# Patient Record
Sex: Male | Born: 1950 | Race: White | Hispanic: No | Marital: Single | Smoking: Never smoker
Health system: Southern US, Community
[De-identification: ages and names within clinical notes are randomized; demographics above are authoritative.]

## PROBLEM LIST (undated history)

## (undated) DIAGNOSIS — L0291 Cutaneous abscess, unspecified: Secondary | ICD-10-CM

## (undated) DIAGNOSIS — E7800 Pure hypercholesterolemia, unspecified: Secondary | ICD-10-CM

## (undated) DIAGNOSIS — M869 Osteomyelitis, unspecified: Secondary | ICD-10-CM

## (undated) DIAGNOSIS — E119 Type 2 diabetes mellitus without complications: Secondary | ICD-10-CM

## (undated) DIAGNOSIS — N183 Chronic kidney disease, stage 3 unspecified: Secondary | ICD-10-CM

## (undated) DIAGNOSIS — I5022 Chronic systolic (congestive) heart failure: Secondary | ICD-10-CM

## (undated) DIAGNOSIS — B20 Human immunodeficiency virus [HIV] disease: Secondary | ICD-10-CM

## (undated) DIAGNOSIS — I10 Essential (primary) hypertension: Secondary | ICD-10-CM

## (undated) HISTORY — PX: CARDIAC VALVE REPLACEMENT: SHX585

---

## 2019-05-15 ENCOUNTER — Ambulatory Visit (HOSPITAL_COMMUNITY)
Admission: EM | Admit: 2019-05-15 | Discharge: 2019-05-15 | Disposition: A | Discharge status: Home / Self Care | Payer: Medicare Other | Source: Home / Self Care

## 2019-05-15 ENCOUNTER — Inpatient Hospital Stay (HOSPITAL_COMMUNITY)
Admission: EM | Admit: 2019-05-15 | Discharge: 2019-05-25 | DRG: 638 | Disposition: E | Payer: Medicare Other | Source: Other Acute Inpatient Hospital | Attending: Internal Medicine | Admitting: Internal Medicine

## 2019-05-15 ENCOUNTER — Emergency Department (HOSPITAL_COMMUNITY): Payer: Medicare Other

## 2019-05-15 ENCOUNTER — Other Ambulatory Visit: Payer: Self-pay

## 2019-05-15 ENCOUNTER — Encounter (HOSPITAL_COMMUNITY): Payer: Self-pay | Admitting: Emergency Medicine

## 2019-05-15 DIAGNOSIS — E1169 Type 2 diabetes mellitus with other specified complication: Secondary | ICD-10-CM | POA: Diagnosis not present

## 2019-05-15 DIAGNOSIS — Z7982 Long term (current) use of aspirin: Secondary | ICD-10-CM

## 2019-05-15 DIAGNOSIS — E11628 Type 2 diabetes mellitus with other skin complications: Secondary | ICD-10-CM | POA: Diagnosis present

## 2019-05-15 DIAGNOSIS — L97309 Non-pressure chronic ulcer of unspecified ankle with unspecified severity: Secondary | ICD-10-CM

## 2019-05-15 DIAGNOSIS — E1151 Type 2 diabetes mellitus with diabetic peripheral angiopathy without gangrene: Secondary | ICD-10-CM | POA: Diagnosis present

## 2019-05-15 DIAGNOSIS — I872 Venous insufficiency (chronic) (peripheral): Secondary | ICD-10-CM | POA: Diagnosis present

## 2019-05-15 DIAGNOSIS — E11622 Type 2 diabetes mellitus with other skin ulcer: Secondary | ICD-10-CM | POA: Diagnosis not present

## 2019-05-15 DIAGNOSIS — N179 Acute kidney failure, unspecified: Secondary | ICD-10-CM | POA: Diagnosis present

## 2019-05-15 DIAGNOSIS — R000 Tachycardia, unspecified: Secondary | ICD-10-CM | POA: Diagnosis present

## 2019-05-15 DIAGNOSIS — I5042 Chronic combined systolic (congestive) and diastolic (congestive) heart failure: Secondary | ICD-10-CM | POA: Diagnosis present

## 2019-05-15 DIAGNOSIS — M868X7 Other osteomyelitis, ankle and foot: Secondary | ICD-10-CM | POA: Diagnosis present

## 2019-05-15 DIAGNOSIS — E669 Obesity, unspecified: Secondary | ICD-10-CM | POA: Diagnosis present

## 2019-05-15 DIAGNOSIS — B20 Human immunodeficiency virus [HIV] disease: Secondary | ICD-10-CM | POA: Diagnosis present

## 2019-05-15 DIAGNOSIS — R238 Other skin changes: Secondary | ICD-10-CM | POA: Diagnosis present

## 2019-05-15 DIAGNOSIS — Z952 Presence of prosthetic heart valve: Secondary | ICD-10-CM

## 2019-05-15 DIAGNOSIS — I10 Essential (primary) hypertension: Secondary | ICD-10-CM | POA: Diagnosis present

## 2019-05-15 DIAGNOSIS — I4901 Ventricular fibrillation: Secondary | ICD-10-CM | POA: Diagnosis not present

## 2019-05-15 DIAGNOSIS — S91002A Unspecified open wound, left ankle, initial encounter: Secondary | ICD-10-CM

## 2019-05-15 DIAGNOSIS — R54 Age-related physical debility: Secondary | ICD-10-CM | POA: Diagnosis present

## 2019-05-15 DIAGNOSIS — I130 Hypertensive heart and chronic kidney disease with heart failure and stage 1 through stage 4 chronic kidney disease, or unspecified chronic kidney disease: Secondary | ICD-10-CM | POA: Diagnosis present

## 2019-05-15 DIAGNOSIS — M65862 Other synovitis and tenosynovitis, left lower leg: Secondary | ICD-10-CM | POA: Diagnosis present

## 2019-05-15 DIAGNOSIS — N1832 Chronic kidney disease, stage 3b: Secondary | ICD-10-CM | POA: Diagnosis present

## 2019-05-15 DIAGNOSIS — I5022 Chronic systolic (congestive) heart failure: Secondary | ICD-10-CM | POA: Diagnosis present

## 2019-05-15 DIAGNOSIS — Z7984 Long term (current) use of oral hypoglycemic drugs: Secondary | ICD-10-CM

## 2019-05-15 DIAGNOSIS — Z20822 Contact with and (suspected) exposure to covid-19: Secondary | ICD-10-CM | POA: Diagnosis present

## 2019-05-15 DIAGNOSIS — D72829 Elevated white blood cell count, unspecified: Secondary | ICD-10-CM | POA: Diagnosis present

## 2019-05-15 DIAGNOSIS — E119 Type 2 diabetes mellitus without complications: Secondary | ICD-10-CM

## 2019-05-15 DIAGNOSIS — R0902 Hypoxemia: Secondary | ICD-10-CM

## 2019-05-15 DIAGNOSIS — I472 Ventricular tachycardia: Secondary | ICD-10-CM | POA: Diagnosis not present

## 2019-05-15 DIAGNOSIS — E1152 Type 2 diabetes mellitus with diabetic peripheral angiopathy with gangrene: Secondary | ICD-10-CM | POA: Diagnosis present

## 2019-05-15 DIAGNOSIS — E1122 Type 2 diabetes mellitus with diabetic chronic kidney disease: Secondary | ICD-10-CM | POA: Diagnosis present

## 2019-05-15 DIAGNOSIS — N183 Chronic kidney disease, stage 3 unspecified: Secondary | ICD-10-CM | POA: Diagnosis present

## 2019-05-15 DIAGNOSIS — Z6822 Body mass index (BMI) 22.0-22.9, adult: Secondary | ICD-10-CM

## 2019-05-15 DIAGNOSIS — E7800 Pure hypercholesterolemia, unspecified: Secondary | ICD-10-CM | POA: Diagnosis present

## 2019-05-15 DIAGNOSIS — Z79899 Other long term (current) drug therapy: Secondary | ICD-10-CM

## 2019-05-15 DIAGNOSIS — Z21 Asymptomatic human immunodeficiency virus [HIV] infection status: Secondary | ICD-10-CM | POA: Diagnosis present

## 2019-05-15 DIAGNOSIS — N189 Chronic kidney disease, unspecified: Secondary | ICD-10-CM | POA: Diagnosis present

## 2019-05-15 DIAGNOSIS — E785 Hyperlipidemia, unspecified: Secondary | ICD-10-CM | POA: Diagnosis present

## 2019-05-15 DIAGNOSIS — L97329 Non-pressure chronic ulcer of left ankle with unspecified severity: Secondary | ICD-10-CM | POA: Diagnosis present

## 2019-05-15 DIAGNOSIS — Z5329 Procedure and treatment not carried out because of patient's decision for other reasons: Secondary | ICD-10-CM | POA: Diagnosis not present

## 2019-05-15 DIAGNOSIS — M869 Osteomyelitis, unspecified: Secondary | ICD-10-CM | POA: Diagnosis present

## 2019-05-15 DIAGNOSIS — L089 Local infection of the skin and subcutaneous tissue, unspecified: Secondary | ICD-10-CM | POA: Diagnosis present

## 2019-05-15 DIAGNOSIS — L02612 Cutaneous abscess of left foot: Secondary | ICD-10-CM | POA: Diagnosis present

## 2019-05-15 HISTORY — DX: Chronic kidney disease, stage 3 unspecified: N18.30

## 2019-05-15 HISTORY — DX: Essential (primary) hypertension: I10

## 2019-05-15 HISTORY — DX: Pure hypercholesterolemia, unspecified: E78.00

## 2019-05-15 HISTORY — DX: Cutaneous abscess, unspecified: L02.91

## 2019-05-15 HISTORY — DX: Type 2 diabetes mellitus without complications: E11.9

## 2019-05-15 HISTORY — DX: Osteomyelitis, unspecified: M86.9

## 2019-05-15 HISTORY — DX: Chronic systolic (congestive) heart failure: I50.22

## 2019-05-15 HISTORY — DX: Human immunodeficiency virus (HIV) disease: B20

## 2019-05-15 LAB — LACTIC ACID, PLASMA
Lactic Acid, Venous: 2.2 mmol/L (ref 0.5–1.9)
Lactic Acid, Venous: 1.8 mmol/L (ref 0.5–1.9)

## 2019-05-15 LAB — COMPREHENSIVE METABOLIC PANEL
ALT: 23 U/L (ref 0–44)
AST: 27 U/L (ref 15–41)
Albumin: 2.7 g/dL — ABNORMAL LOW (ref 3.5–5.0)
Alkaline Phosphatase: 61 U/L (ref 38–126)
Anion gap: 16 — ABNORMAL HIGH (ref 5–15)
BUN: 142 mg/dL — ABNORMAL HIGH (ref 8–23)
CO2: 22 mmol/L (ref 22–32)
Calcium: 8.8 mg/dL — ABNORMAL LOW (ref 8.9–10.3)
Chloride: 100 mmol/L (ref 98–111)
Creatinine, Ser: 4.68 mg/dL — ABNORMAL HIGH (ref 0.61–1.24)
GFR calc Af Amer: 14 mL/min — ABNORMAL LOW (ref >60)
GFR calc non Af Amer: 12 mL/min — ABNORMAL LOW (ref >60)
Glucose, Bld: 190 mg/dL — ABNORMAL HIGH (ref 70–99)
Potassium: 3.7 mmol/L (ref 3.5–5.1)
Sodium: 138 mmol/L (ref 135–145)
Total Bilirubin: 0.9 mg/dL (ref 0.3–1.2)
Total Protein: 8.1 g/dL (ref 6.5–8.1)

## 2019-05-15 LAB — CBC WITH DIFFERENTIAL/PLATELET
Abs Immature Granulocytes: 0.14 10*3/uL — ABNORMAL HIGH (ref 0.00–0.07)
Basophils Absolute: 0 10*3/uL (ref 0.0–0.1)
Basophils Relative: 0 %
Eosinophils Absolute: 0 10*3/uL (ref 0.0–0.5)
Eosinophils Relative: 0 %
HCT: 38.3 % — ABNORMAL LOW (ref 39.0–52.0)
Hemoglobin: 12.4 g/dL — ABNORMAL LOW (ref 13.0–17.0)
Immature Granulocytes: 1 %
Lymphocytes Relative: 4 %
Lymphs Abs: 0.7 10*3/uL (ref 0.7–4.0)
MCH: 31.3 pg (ref 26.0–34.0)
MCHC: 32.4 g/dL (ref 30.0–36.0)
MCV: 96.7 fL (ref 80.0–100.0)
Monocytes Absolute: 0.7 10*3/uL (ref 0.1–1.0)
Monocytes Relative: 4 %
Neutro Abs: 14.5 10*3/uL — ABNORMAL HIGH (ref 1.7–7.7)
Neutrophils Relative %: 91 %
Platelets: 144 10*3/uL — ABNORMAL LOW (ref 150–400)
RBC: 3.96 MIL/uL — ABNORMAL LOW (ref 4.22–5.81)
RDW: 15.9 % — ABNORMAL HIGH (ref 11.5–15.5)
WBC: 16.1 10*3/uL — ABNORMAL HIGH (ref 4.0–10.5)
nRBC: 0 % (ref 0.0–0.2)

## 2019-05-15 LAB — PROTIME-INR
INR: 1.6 — ABNORMAL HIGH (ref 0.8–1.2)
Prothrombin Time: 19.2 seconds — ABNORMAL HIGH (ref 11.4–15.2)

## 2019-05-15 MED ORDER — PIPERACILLIN-TAZOBACTAM 3.375 G IVPB 30 MIN
3.3750 g | Freq: Once | INTRAVENOUS | Status: AC
  Administered 2019-05-15: 3.375 g via INTRAVENOUS
  Filled 2019-05-15: qty 50

## 2019-05-15 MED ORDER — VANCOMYCIN HCL IN DEXTROSE 1-5 GM/200ML-% IV SOLN
1000.0000 mg | Freq: Once | INTRAVENOUS | Status: AC
  Administered 2019-05-15: 1000 mg via INTRAVENOUS
  Filled 2019-05-15: qty 200

## 2019-05-15 MED ORDER — SODIUM CHLORIDE 0.9 % IV BOLUS
500.0000 mL | Freq: Once | INTRAVENOUS | Status: AC
  Administered 2019-05-16: 500 mL via INTRAVENOUS

## 2019-05-15 NOTE — ED Triage Notes (Signed)
Patient is being discharged from the [facility]gent [facility] and sent to the Emergency Department by private vehicle.  Per Patterson Hammersmith, PA, and Dr. Leonides Grills, patient is stable but in need of higher level of care due to diabetic wound in joint. Patient is aware and verbalizes understanding of plan of care.

## 2019-05-15 NOTE — ED Provider Notes (Signed)
[facility] Provider Note   CSN: [id number] Arrival date & time: 05-26-19  1607     History Chief Complaint  Patient presents with  . diabetic wound    [facility] is a 69 y.o. male.  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  Patient reports over the last 2 to 3 days its become larger and started draining and becoming foul swelling as well as surrounding erythema.  Patient reports that he is visiting from [place] and it appears things have gotten worse since driving down in the car.  He denies systemic symptoms and states he otherwise feels fine however was noted to have a temperature of 100.2 upon arrival here.  He denies any chest pain, shortness of breath, nausea, vomiting or abdominal pain.  The history is provided by the patient.       Past Medical History:  Diagnosis Date  . Diabetes mellitus without complication ([place])   . High cholesterol   . HIV (human immunodeficiency virus infection) ([place])   . Hypertension     There are no problems to display for this patient.   Past Surgical History:  Procedure Laterality Date  . CARDIAC VALVE REPLACEMENT         No family history on file.  Social History   Tobacco Use  . Smoking status: Not on file  Substance Use Topics  . Alcohol use: Not on file  . Drug use: Not on file    Home Medications Prior to Admission medications   Medication Sig Start Date End Date Taking? Authorizing Provider  aspirin EC 81 MG tablet Take 81 mg by mouth in the morning.   Yes [provider]  furosemide (LASIX) 40 MG tablet Take 40 mg by mouth in the morning.  03/11/19  Yes [provider]  gabapentin (NEURONTIN) 300 MG capsule Take 300 mg by mouth in the morning and at bedtime.  04/19/19  Yes  [provider]  glipiZIDE (GLUCOTROL XL) 5 MG 24 hr tablet Take 5 mg by mouth daily after breakfast.  04/10/19  Yes [provider]  metoprolol tartrate (LOPRESSOR) 100 MG tablet Take 50 mg by mouth 2 (two) times daily.  02/08/18  Yes [provider]  simvastatin (ZOCOR) 40 MG tablet Take 40 mg by mouth at bedtime. 04/25/19  Yes [provider]  TRIUMEQ 600-50-300 MG tablet Take 1 tablet by mouth daily.  05/09/19  Yes [provider]  metolazone (ZAROXOLYN) 2.5 MG tablet Take 2.5 mg by mouth 2 (two) times a week. ON HOLD 04/10/19   [provider]    Allergies    Patient has no known allergies.  Review of Systems   Review of Systems  All other systems reviewed and are negative.   Physical Exam Updated Vital Signs BP 107/74 (BP Location: Left Arm)   Pulse 78   Temp 98.1 F (36.7 C) (Oral)   Resp 16   Ht 5\' 9"  (1.753 m)   Wt 70.3 kg   SpO2 100%   BMI 22.89 kg/m   Physical Exam Vitals and nursing note reviewed.  Constitutional:      General: He is not in acute distress.    Appearance: He is well-developed. He is obese. He is ill-appearing.     Comments: Chronically ill-appearing  HENT:     Head: Normocephalic and atraumatic.  Eyes:     Conjunctiva/sclera: Conjunctivae normal.     Pupils: Pupils are equal, round, and reactive to light.  Cardiovascular:     Rate and Rhythm: Normal rate and regular rhythm.     Heart sounds: No murmur.  Pulmonary:     Effort: Pulmonary effort is normal. No respiratory distress.     Breath sounds: Normal breath sounds. No wheezing or rales.  Abdominal:     General: There is no distension.     Palpations: Abdomen is soft.     Tenderness: There is no abdominal tenderness. There is no guarding or rebound.  Musculoskeletal:        General: Tenderness present. Normal range of motion.     Cervical back: Normal range of motion and neck supple.     Left lower leg: No edema.       Feet:      Comments: Skin findings consistent with chronic venous insufficiency  Skin:    General: Skin is warm and dry.     Findings: No erythema or rash.  Neurological:     General: No focal deficit present.     Mental Status: He is alert and oriented to person, place, and time. Mental status is at baseline.  Psychiatric:        Mood and Affect: Mood normal.        Behavior: Behavior normal.        Thought Content: Thought content normal.     ED Results / Procedures / Treatments   Labs (all labs ordered are listed, but only abnormal results are displayed) Labs Reviewed  COMPREHENSIVE METABOLIC PANEL - Abnormal; Notable for the following components:      Result Value   Glucose, Bld 190 (*)    BUN 142 (*)    Creatinine, Ser 4.68 (*)    Calcium 8.8 (*)    Albumin 2.7 (*)    GFR calc non Af Amer 12 (*)    GFR calc Af Amer 14 (*)    Anion gap 16 (*)    All other components within normal limits  LACTIC ACID, PLASMA - Abnormal; Notable for the following components:   Lactic Acid, Venous 2.2 (*)    All other components within normal limits  CBC WITH DIFFERENTIAL/PLATELET - Abnormal; Notable for the following components:   WBC 16.1 (*)    RBC 3.96 (*)    Hemoglobin 12.4 (*)    HCT 38.3 (*)    RDW 15.9 (*)    Platelets 144 (*)    Neutro Abs 14.5 (*)    Abs Immature Granulocytes 0.14 (*)    All other components within normal limits  PROTIME-INR - Abnormal; Notable for the following components:   Prothrombin Time 19.2 (*)    INR 1.6 (*)    All other components within normal limits  CULTURE, BLOOD (ROUTINE X 2)  CULTURE, BLOOD (ROUTINE X 2)  LACTIC ACID, PLASMA  URINALYSIS, ROUTINE W REFLEX MICROSCOPIC    EKG None  Radiology DG Ankle Complete Left  Result Date: 2019/05/31 CLINICAL DATA:  69 year old male with diabetic wound. EXAM: LEFT ANKLE COMPLETE - 3+ VIEW COMPARISON:  None. FINDINGS: There is no acute fracture or dislocation. No bone erosion or periosteal elevation. The ankle  mortise is intact. The bones are mildly osteopenic. There is soft tissue swelling and ulceration with small pockets of soft tissue air over the lateral malleolus in keeping with diabetic  wound. Correlation with clinical exam recommended. IMPRESSION: 1. No acute fracture or dislocation. No radiographic evidence of acute osteomyelitis. MRI or a WBC nuclear scan may provide better evaluation if there is high clinical concern for acute osteomyelitis. 2. Soft tissue swelling and ulceration over the lateral malleolus in keeping with diabetic wound. Electronically Signed   By: Elgie Collard M.D.   On: 05/23/2019 17:17    Procedures Procedures (including critical care time)  Medications Ordered in ED Medications - No data to display  ED Course  I have reviewed the triage vital signs and the nursing notes.  Pertinent labs & imaging results that were available during my care of the patient were reviewed by me and considered in my medical decision making (see chart for details).    MDM Rules/Calculators/A&P                     Patient is an elderly gentleman with multiple medical problems including HIV, diabetes and chronic kidney disease presenting today with necrotic foul-smelling wound to the left lateral malleolus.  He does have surrounding erythema and based on vital signs he was febrile to 100.2 earlier today.  Patient is visiting from out of town and has no close follow-up.  He verges on sepsis at this time with a lactate of 2.2, leukocytosis of 16,000 and it seems that his creatinine is at baseline at 4.68.  Feel that patient definitely needs antibiotics and warrants admission to ensure wound is improving and wound care.  MDM Number of Diagnoses or Management Options   Amount and/or Complexity of Data Reviewed Clinical lab tests: ordered and reviewed Tests in the radiology section of CPT: ordered and reviewed Discussion of test results with the performing providers: yes Decide to obtain  previous medical records or to obtain history from someone other than the patient: yes Obtain history from someone other than the patient: no Review and summarize past medical records: no Discuss the patient with other providers: yes Independent visualization of images, tracings, or specimens: yes  Risk of Complications, Morbidity, and/or Mortality Presenting problems: moderate Diagnostic procedures: minimal Management options: low  Patient Progress Patient progress: stable    Final Clinical Impression(s) / ED Diagnoses Final diagnoses:  Wound of ankle, left, initial encounter  Diabetic ankle ulcer (HCC)    Rx / DC Orders ED Discharge Orders    None       Gwyneth Sprout, MD 05/19/2019 2224

## 2019-05-15 NOTE — ED Triage Notes (Addendum)
Sent here from [facility] for further follow up of diabetic wound on left foot. Traveling from [place] - staying at a hotel-- wound is bandaged from [facility]

## 2019-05-16 ENCOUNTER — Encounter (HOSPITAL_COMMUNITY): Payer: Medicare Other

## 2019-05-16 ENCOUNTER — Inpatient Hospital Stay (HOSPITAL_COMMUNITY): Payer: Medicare Other

## 2019-05-16 ENCOUNTER — Encounter (HOSPITAL_COMMUNITY): Payer: Self-pay | Admitting: Internal Medicine

## 2019-05-16 DIAGNOSIS — N189 Chronic kidney disease, unspecified: Secondary | ICD-10-CM | POA: Diagnosis not present

## 2019-05-16 DIAGNOSIS — E1152 Type 2 diabetes mellitus with diabetic peripheral angiopathy with gangrene: Secondary | ICD-10-CM | POA: Diagnosis present

## 2019-05-16 DIAGNOSIS — E11622 Type 2 diabetes mellitus with other skin ulcer: Secondary | ICD-10-CM | POA: Diagnosis present

## 2019-05-16 DIAGNOSIS — L97329 Non-pressure chronic ulcer of left ankle with unspecified severity: Secondary | ICD-10-CM | POA: Diagnosis present

## 2019-05-16 DIAGNOSIS — E1122 Type 2 diabetes mellitus with diabetic chronic kidney disease: Secondary | ICD-10-CM | POA: Diagnosis present

## 2019-05-16 DIAGNOSIS — M65862 Other synovitis and tenosynovitis, left lower leg: Secondary | ICD-10-CM | POA: Diagnosis present

## 2019-05-16 DIAGNOSIS — Z5329 Procedure and treatment not carried out because of patient's decision for other reasons: Secondary | ICD-10-CM | POA: Diagnosis not present

## 2019-05-16 DIAGNOSIS — N1832 Chronic kidney disease, stage 3b: Secondary | ICD-10-CM | POA: Diagnosis present

## 2019-05-16 DIAGNOSIS — E1169 Type 2 diabetes mellitus with other specified complication: Secondary | ICD-10-CM | POA: Diagnosis present

## 2019-05-16 DIAGNOSIS — Z7982 Long term (current) use of aspirin: Secondary | ICD-10-CM | POA: Diagnosis not present

## 2019-05-16 DIAGNOSIS — I130 Hypertensive heart and chronic kidney disease with heart failure and stage 1 through stage 4 chronic kidney disease, or unspecified chronic kidney disease: Secondary | ICD-10-CM | POA: Diagnosis present

## 2019-05-16 DIAGNOSIS — I96 Gangrene, not elsewhere classified: Secondary | ICD-10-CM | POA: Diagnosis not present

## 2019-05-16 DIAGNOSIS — L089 Local infection of the skin and subcutaneous tissue, unspecified: Secondary | ICD-10-CM | POA: Diagnosis present

## 2019-05-16 DIAGNOSIS — N179 Acute kidney failure, unspecified: Secondary | ICD-10-CM | POA: Diagnosis present

## 2019-05-16 DIAGNOSIS — Z952 Presence of prosthetic heart valve: Secondary | ICD-10-CM | POA: Diagnosis not present

## 2019-05-16 DIAGNOSIS — E11628 Type 2 diabetes mellitus with other skin complications: Secondary | ICD-10-CM | POA: Diagnosis present

## 2019-05-16 DIAGNOSIS — I4901 Ventricular fibrillation: Secondary | ICD-10-CM | POA: Diagnosis not present

## 2019-05-16 DIAGNOSIS — E7800 Pure hypercholesterolemia, unspecified: Secondary | ICD-10-CM | POA: Diagnosis present

## 2019-05-16 DIAGNOSIS — L02612 Cutaneous abscess of left foot: Secondary | ICD-10-CM | POA: Diagnosis present

## 2019-05-16 DIAGNOSIS — I5042 Chronic combined systolic (congestive) and diastolic (congestive) heart failure: Secondary | ICD-10-CM | POA: Diagnosis present

## 2019-05-16 DIAGNOSIS — Z21 Asymptomatic human immunodeficiency virus [HIV] infection status: Secondary | ICD-10-CM | POA: Diagnosis present

## 2019-05-16 DIAGNOSIS — Z20822 Contact with and (suspected) exposure to covid-19: Secondary | ICD-10-CM | POA: Diagnosis present

## 2019-05-16 DIAGNOSIS — M86672 Other chronic osteomyelitis, left ankle and foot: Secondary | ICD-10-CM | POA: Diagnosis not present

## 2019-05-16 DIAGNOSIS — Z79899 Other long term (current) drug therapy: Secondary | ICD-10-CM | POA: Diagnosis not present

## 2019-05-16 DIAGNOSIS — S91002A Unspecified open wound, left ankle, initial encounter: Secondary | ICD-10-CM

## 2019-05-16 DIAGNOSIS — M868X7 Other osteomyelitis, ankle and foot: Secondary | ICD-10-CM | POA: Diagnosis present

## 2019-05-16 DIAGNOSIS — Z7984 Long term (current) use of oral hypoglycemic drugs: Secondary | ICD-10-CM | POA: Diagnosis not present

## 2019-05-16 DIAGNOSIS — I472 Ventricular tachycardia: Secondary | ICD-10-CM | POA: Diagnosis not present

## 2019-05-16 DIAGNOSIS — E785 Hyperlipidemia, unspecified: Secondary | ICD-10-CM | POA: Diagnosis present

## 2019-05-16 DIAGNOSIS — M869 Osteomyelitis, unspecified: Secondary | ICD-10-CM | POA: Diagnosis not present

## 2019-05-16 LAB — CBC WITH DIFFERENTIAL/PLATELET
Abs Immature Granulocytes: 0 10*3/uL (ref 0.00–0.07)
Basophils Absolute: 0 10*3/uL (ref 0.0–0.1)
Basophils Relative: 0 %
Eosinophils Absolute: 0 10*3/uL (ref 0.0–0.5)
Eosinophils Relative: 0 %
HCT: 36.2 % — ABNORMAL LOW (ref 39.0–52.0)
Hemoglobin: 11.6 g/dL — ABNORMAL LOW (ref 13.0–17.0)
Lymphocytes Relative: 1 %
Lymphs Abs: 0.2 10*3/uL — ABNORMAL LOW (ref 0.7–4.0)
MCH: 30.6 pg (ref 26.0–34.0)
MCHC: 32 g/dL (ref 30.0–36.0)
MCV: 95.5 fL (ref 80.0–100.0)
Monocytes Absolute: 0.5 10*3/uL (ref 0.1–1.0)
Monocytes Relative: 3 %
Neutro Abs: 15.6 10*3/uL — ABNORMAL HIGH (ref 1.7–7.7)
Neutrophils Relative %: 96 %
Platelets: DECREASED 10*3/uL (ref 150–400)
RBC: 3.79 MIL/uL — ABNORMAL LOW (ref 4.22–5.81)
RDW: 15.9 % — ABNORMAL HIGH (ref 11.5–15.5)
WBC: 16.2 10*3/uL — ABNORMAL HIGH (ref 4.0–10.5)
nRBC: 0 % (ref 0.0–0.2)

## 2019-05-16 LAB — SURGICAL PCR SCREEN
MRSA, PCR: NEGATIVE
Staphylococcus aureus: NEGATIVE

## 2019-05-16 LAB — URINALYSIS, ROUTINE W REFLEX MICROSCOPIC
Bilirubin Urine: NEGATIVE
Glucose, UA: NEGATIVE mg/dL
Ketones, ur: NEGATIVE mg/dL
Leukocytes,Ua: NEGATIVE
Nitrite: NEGATIVE
Protein, ur: 100 mg/dL — AB
Specific Gravity, Urine: 1.013 (ref 1.005–1.030)
pH: 5 (ref 5.0–8.0)

## 2019-05-16 LAB — GLUCOSE, CAPILLARY
Glucose-Capillary: 155 mg/dL — ABNORMAL HIGH (ref 70–99)
Glucose-Capillary: 109 mg/dL — ABNORMAL HIGH (ref 70–99)
Glucose-Capillary: 135 mg/dL — ABNORMAL HIGH (ref 70–99)
Glucose-Capillary: 144 mg/dL — ABNORMAL HIGH (ref 70–99)
Glucose-Capillary: 130 mg/dL — ABNORMAL HIGH (ref 70–99)

## 2019-05-16 LAB — BASIC METABOLIC PANEL
Anion gap: 17 — ABNORMAL HIGH (ref 5–15)
BUN: 140 mg/dL — ABNORMAL HIGH (ref 8–23)
CO2: 19 mmol/L — ABNORMAL LOW (ref 22–32)
Calcium: 8.7 mg/dL — ABNORMAL LOW (ref 8.9–10.3)
Chloride: 102 mmol/L (ref 98–111)
Creatinine, Ser: 4.41 mg/dL — ABNORMAL HIGH (ref 0.61–1.24)
GFR calc Af Amer: 15 mL/min — ABNORMAL LOW (ref >60)
GFR calc non Af Amer: 13 mL/min — ABNORMAL LOW (ref >60)
Glucose, Bld: 118 mg/dL — ABNORMAL HIGH (ref 70–99)
Potassium: 4.3 mmol/L (ref 3.5–5.1)
Sodium: 138 mmol/L (ref 135–145)

## 2019-05-16 LAB — SARS CORONAVIRUS 2 (TAT 6-24 HRS): SARS Coronavirus 2: NEGATIVE

## 2019-05-16 LAB — MAGNESIUM: Magnesium: 2.4 mg/dL (ref 1.7–2.4)

## 2019-05-16 MED ORDER — SODIUM CHLORIDE 0.9 % IV SOLN
2.0000 g | INTRAVENOUS | Status: DC
  Administered 2019-05-16 – 2019-05-17 (×2): 2 g via INTRAVENOUS
  Filled 2019-05-16 (×2): qty 2

## 2019-05-16 MED ORDER — INSULIN ASPART 100 UNIT/ML ~~LOC~~ SOLN
0.0000 [IU] | Freq: Three times a day (TID) | SUBCUTANEOUS | Status: DC
  Administered 2019-05-16 (×3): 2 [IU] via SUBCUTANEOUS
  Administered 2019-05-17: 3 [IU] via SUBCUTANEOUS
  Administered 2019-05-17 (×2): 5 [IU] via SUBCUTANEOUS

## 2019-05-16 MED ORDER — VANCOMYCIN HCL 750 MG/150ML IV SOLN
750.0000 mg | INTRAVENOUS | Status: DC
  Administered 2019-05-17: 750 mg via INTRAVENOUS
  Filled 2019-05-16: qty 150

## 2019-05-16 MED ORDER — METRONIDAZOLE IN NACL 5-0.79 MG/ML-% IV SOLN
500.0000 mg | Freq: Three times a day (TID) | INTRAVENOUS | Status: DC
  Administered 2019-05-16 – 2019-05-17 (×6): 500 mg via INTRAVENOUS
  Filled 2019-05-16 (×6): qty 100

## 2019-05-16 MED ORDER — HEPARIN SODIUM (PORCINE) 5000 UNIT/ML IJ SOLN
5000.0000 [IU] | Freq: Three times a day (TID) | INTRAMUSCULAR | Status: DC
  Administered 2019-05-16 – 2019-05-17 (×5): 5000 [IU] via SUBCUTANEOUS
  Filled 2019-05-16 (×5): qty 1

## 2019-05-16 MED ORDER — ABACAVIR-DOLUTEGRAVIR-LAMIVUD 600-50-300 MG PO TABS
1.0000 | ORAL_TABLET | Freq: Every day | ORAL | Status: DC
  Administered 2019-05-16 – 2019-05-17 (×2): 1 via ORAL
  Filled 2019-05-16 (×3): qty 1

## 2019-05-16 NOTE — Progress Notes (Signed)
Patient admitted after midnight.  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  Multiple medical issues including HIV/chronic systolic CHF, DM, CKD.  Here with a worsening LE wound.  Feet cool to touch.  Have asked nursing to Doppler pulses.  MRI to rule out osteomyelitis pending.  ABIs ordered and are pending.  Pending these results will need either vascular or orthopedic consultation.  Marlin Canary DO

## 2019-05-16 NOTE — ED Notes (Signed)
Paged [facility] Frederick Peers to Dr Nicanor Alcon

## 2019-05-16 NOTE — Plan of Care (Signed)

## 2019-05-16 NOTE — H&P (Signed)
History and Physical    [facility] PIR:[id number] DOB: 14-Jun-1950 DOA: 04/27/2019  PCP: Patient, No Pcp Per Patient coming from: home  I have personally briefly reviewed patient's old medical records in [facility]  Chief Complaint: Worsening left foot with swelling and drainage  HPI: [facility] is a 69 yo AA male with PMH HIV (compliant on Triumeq), DMII, gastritis, HFrEF (25-35% on 03/2019 echo), HTN, HLD who lives in [place] and presents to the ER with a worsening left foot infection.  He states he has had the infection for "a while" but cannot get a timeframe.  He does not remember how it started or if he injured it to begin with.  He was accompanying a friend from [place] down to [place] [employer] for the [employer].  Upon arriving in [place], he noticed his foot was continuing to worsen in terms of the swelling and drainage, thus they went to the urgent care and he was then referred to the ER. X-ray was performed which was notable for soft tissue swelling and ulceration over the lateral malleolus. Initial lactic acid 2.2 which normalized down to 1.8 on repeat.  He has been managed closely in [place] during a recent hospitalization for acute on chronic renal failure. His baseline creatinine is around 3.3-3.6; on admission his creatinine was 4.5.  His BUN had also significantly up trended from baseline, now up to 144 (baseline in the 70s). WBC 16.1 with left shift. Borderline fever 100.2 in ER.  He was given Vanc and Zosyn in the ER x 1 dose.   He is admitted for further infectious work-up regarding his left diabetic foot ulcer.    Review of Systems: As per HPI otherwise 10 point review of systems negative.   Past Medical History:  Diagnosis Date  . Diabetes mellitus without complication (HCC)   . High cholesterol   . HIV (human immunodeficiency virus infection) (HCC)   . Hypertension     Past Surgical History:  Procedure Laterality Date  .  CARDIAC VALVE REPLACEMENT       has no history on file for tobacco, alcohol, and drug.  No Known Allergies  Family History  Problem Relation Age of Onset  . Heart disease Other    Prior to Admission medications   Medication Sig Start Date End Date Taking? Authorizing Provider  aspirin EC 81 MG tablet Take 81 mg by mouth in the morning.   Yes [provider]  furosemide (LASIX) 40 MG tablet Take 40 mg by mouth in the morning.  03/11/19  Yes [provider]  gabapentin (NEURONTIN) 300 MG capsule Take 300 mg by mouth in the morning and at bedtime.  04/19/19  Yes [provider]  glipiZIDE (GLUCOTROL XL) 5 MG 24 hr tablet Take 5 mg by mouth daily after breakfast.  04/10/19  Yes [provider]  metoprolol tartrate (LOPRESSOR) 100 MG tablet Take 50 mg by mouth 2 (two) times daily.  02/08/18  Yes [provider]  simvastatin (ZOCOR) 40 MG tablet Take 40 mg by mouth at bedtime. 04/25/19  Yes [provider]  TRIUMEQ 600-50-300 MG tablet Take 1 tablet by mouth daily.  05/09/19  Yes [provider]  metolazone (ZAROXOLYN) 2.5 MG tablet Take 2.5 mg by mouth 2 (two) times a week. ON HOLD 04/10/19   [provider]    Physical Exam: Vitals:   05/19/2019 2130 05/08/2019 2145 05/16/19 0015 05/16/19 0030  BP: 101/70 94/67 106/71 100/74  Pulse: 77 78 (!) 108 100  Resp:   16 13  Temp:      TempSrc:      SpO2: 100% 100% 99% 98%  Weight:      Height:       General appearance: Slightly disheveled/unkempt adult man laying in bed in no distress Head: Normocephalic, without obvious abnormality Eyes: EOMI Lungs: clear to auscultation bilaterally Heart: regular rate and rhythm and S1, S2 normal Abdomen: Soft, nontender, nondistended, bowel sounds present Extremities: Approximately 3 cm ulcer noted over left lateral malleolus with black eschar covering and wound is soft to the touch with drainage of blood and pus.  Small wounds noted on feet  bilaterally Skin: Minor scattered excoriations.  Dry skin throughout Neurologic: Grossly normal  Labs on Admission: I have personally reviewed following labs and imaging studies  CBC: Recent Labs  Lab 05/23/2019 1645  WBC 16.1*  NEUTROABS 14.5*  HGB 12.4*  HCT 38.3*  MCV 96.7  PLT 144*   Basic Metabolic Panel: Recent Labs  Lab 05/10/2019 1645  NA 138  K 3.7  CL 100  CO2 22  GLUCOSE 190*  BUN 142*  CREATININE 4.68*  CALCIUM 8.8*   GFR: Estimated Creatinine Clearance: 14.8 mL/min (A) (by C-G formula based on SCr of 4.68 mg/dL (H)). Liver Function Tests: Recent Labs  Lab 05/03/2019 1645  AST 27  ALT 23  ALKPHOS 61  BILITOT 0.9  PROT 8.1  ALBUMIN 2.7*   No results for input(s): LIPASE, AMYLASE in the last 168 hours. No results for input(s): AMMONIA in the last 168 hours. Coagulation Profile: Recent Labs  Lab 05/06/2019 1645  INR 1.6*   Cardiac Enzymes: No results for input(s): CKTOTAL, CKMB, CKMBINDEX, TROPONINI in the last 168 hours. BNP (last 3 results) No results for input(s): PROBNP in the last 8760 hours. HbA1C: No results for input(s): HGBA1C in the last 72 hours. CBG: No results for input(s): GLUCAP in the last 168 hours. Lipid Profile: No results for input(s): CHOL, HDL, LDLCALC, TRIG, CHOLHDL, LDLDIRECT in the last 72 hours. Thyroid Function Tests: No results for input(s): TSH, T4TOTAL, FREET4, T3FREE, THYROIDAB in the last 72 hours. Anemia Panel: No results for input(s): VITAMINB12, FOLATE, FERRITIN, TIBC, IRON, RETICCTPCT in the last 72 hours. Urine analysis: No results found for: COLORURINE, APPEARANCEUR, LABSPEC, PHURINE, GLUCOSEU, HGBUR, BILIRUBINUR, KETONESUR, PROTEINUR, UROBILINOGEN, NITRITE, LEUKOCYTESUR  Radiological Exams on Admission: DG Ankle Complete Left  Result Date: 04/25/2019 CLINICAL DATA:  69 year old male with diabetic wound. EXAM: LEFT ANKLE COMPLETE - 3+ VIEW COMPARISON:  None. FINDINGS: There is no acute fracture or  dislocation. No bone erosion or periosteal elevation. The ankle mortise is intact. The bones are mildly osteopenic. There is soft tissue swelling and ulceration with small pockets of soft tissue air over the lateral malleolus in keeping with diabetic wound. Correlation with clinical exam recommended. IMPRESSION: 1. No acute fracture or dislocation. No radiographic evidence of acute osteomyelitis. MRI or a WBC nuclear scan may provide better evaluation if there is high clinical concern for acute osteomyelitis. 2. Soft tissue swelling and ulceration over the lateral malleolus in keeping with diabetic wound. Electronically Signed   By: Elgie Collard M.D.   On: 05/01/2019 17:17    Assessment/Plan  Diabetic foot ulcer -Large eschar noted over left foot, lateral malleolus.  X-ray only notable for soft tissue infection.  Concern is for deeper infection/osteomyelitis -Obtain MRI foot without contrast given renal failure -Received vancomycin/Zosyn in the ER -Continue on vancomycin/cefepime/Flagyl and will de-escalate  as able  HIV -Continue Triumeq  DMII - SSI and CBGs - hold gabapentin  HTN - hold lasix and lopressor  HFrEF - no s/s exacerbation - hold lasix for now - hold asa in case of need for surgery   DVT prophylaxis: unfractionated SQ heparin 5000 units 2 hours prior to surgery then every 12 hours Code Status: Full code Family Communication: none Disposition Plan: home Consults called: none Admission status: inpatient   Dwyane Dee, MD [facility] Pager: Secure chat via [employer] [place] pager # : [phone]  If 7PM-7AM, please contact night-coverage [email or web address] Use universal [place] password for that web site. If you do not have the password, please call the hospital operator.  05/16/2019, 1:48 AM

## 2019-05-16 NOTE — Consult Note (Signed)
WOC Nurse Consult Note: Patient receiving care in [facility] 5N[facility]. Reason for Consult: diabetic ulcer Wound type: 100% necrotic left lateral malleolus ulcer Pressure Injury POA: Yes/No/NA Measurement: 3.3 cm x 3 cm Wound bed: 100% blackened, non-viable tissue Drainage (amount, consistency, odor) heavy foul odor Periwound: erythematous Dressing procedure/placement/frequency: Wound currently has a foam dressing over it.  I have sent Dr. Verner Mould a Secure Chat message letting her know I recommend Orthopedic surgery get involved for debridement of area.  I know there is an MRI of the area ordered, yet to be performed.  For now the foam dressing will protect the area. Monitor the wound area(s) for worsening of condition such as: Signs/symptoms of infection,  Increase in size,  Development of or worsening of odor, Development of pain, or increased pain at the affected locations.  Notify the medical team if any of these develop.  Thank you for the consult.  Discussed plan of care with the patient and bedside nurse.  WOC nurse will not follow at this time.  Please re-consult the WOC team if needed.  Helmut Muster, RN, MSN, CWOCN, CNS-BC, pager [phone]

## 2019-05-16 NOTE — Plan of Care (Signed)

## 2019-05-16 NOTE — Progress Notes (Signed)
Pharmacy Antibiotic Note  [facility] is a 70 y.o. male admitted on May 18, 2019 with RLE wound infection.  Pharmacy has been consulted for Vancomycin and Cefepime dosing.  Vancomycin 1 g IV given in ED at  2315  Plan: Vancomycin 750 mg IV q48h Est AUC 444 Cefepime 2 g IV q24h   Height: 5\' 9"  (175.3 cm) Weight: 70.3 kg (155 lb) IBW/kg (Calculated) : 70.7  Temp (24hrs), Avg:98.8 F (37.1 C), Min:98.1 F (36.7 C), Max:100.2 F (37.9 C)  Recent Labs  Lab 18-May-2019 1645 05/11/2019 2221  WBC 16.1*  --   CREATININE 4.68*  --   LATICACIDVEN 2.2* 1.8    Estimated Creatinine Clearance: 14.8 mL/min (A) (by C-G formula based on SCr of 4.68 mg/dL (H)).    No Known Allergies   05/17/19 05/16/2019 1:38 AM

## 2019-05-16 NOTE — Hospital Course (Signed)
Mr. Ellerman is a 69 yo AA male with PMH HIV (compliant on Triumeq), DMII, gastritis, HFrEF (25-35% on 03/2019 echo), HTN, HLD who lives in [place] and presents to the ER with a worsening left foot infection.  He states he has had the infection for "a while" but cannot get a timeframe.  He does not remember how it started or if he injured it to begin with.  He was accompanying a friend from [place] down to [place] [employer] for the [employer].  Upon arriving in [place], he noticed his foot was continuing to worsen in terms of the swelling and drainage, thus they went to the urgent care and he was then referred to the ER. X-ray was performed which was notable for soft tissue swelling and ulceration over the lateral malleolus. Initial lactic acid 2.2 which normalized down to 1.8 on repeat.  He has been managed closely in [place] during a recent hospitalization for acute on chronic renal failure. His baseline creatinine is around 3.3-3.6; on admission his creatinine was 4.5.  His BUN had also significantly up trended from baseline, now up to 144 (baseline in the 70s). WBC 16.1 with left shift. Borderline fever 100.2 in ER.  He was given Vanc and Zosyn in the ER x 1 dose.   He is admitted for further infectious work-up regarding his left diabetic foot ulcer.

## 2019-05-17 ENCOUNTER — Encounter (HOSPITAL_COMMUNITY): Payer: Self-pay | Admitting: Internal Medicine

## 2019-05-17 ENCOUNTER — Inpatient Hospital Stay (HOSPITAL_COMMUNITY): Payer: Medicare Other

## 2019-05-17 DIAGNOSIS — N179 Acute kidney failure, unspecified: Secondary | ICD-10-CM | POA: Diagnosis not present

## 2019-05-17 DIAGNOSIS — I5032 Chronic diastolic (congestive) heart failure: Secondary | ICD-10-CM | POA: Insufficient documentation

## 2019-05-17 DIAGNOSIS — L97309 Non-pressure chronic ulcer of unspecified ankle with unspecified severity: Secondary | ICD-10-CM

## 2019-05-17 DIAGNOSIS — B20 Human immunodeficiency virus [HIV] disease: Secondary | ICD-10-CM | POA: Diagnosis present

## 2019-05-17 DIAGNOSIS — N189 Chronic kidney disease, unspecified: Secondary | ICD-10-CM | POA: Diagnosis present

## 2019-05-17 DIAGNOSIS — R000 Tachycardia, unspecified: Secondary | ICD-10-CM | POA: Diagnosis present

## 2019-05-17 DIAGNOSIS — E119 Type 2 diabetes mellitus without complications: Secondary | ICD-10-CM

## 2019-05-17 DIAGNOSIS — M869 Osteomyelitis, unspecified: Secondary | ICD-10-CM

## 2019-05-17 DIAGNOSIS — I5022 Chronic systolic (congestive) heart failure: Secondary | ICD-10-CM | POA: Diagnosis present

## 2019-05-17 DIAGNOSIS — E11622 Type 2 diabetes mellitus with other skin ulcer: Secondary | ICD-10-CM

## 2019-05-17 DIAGNOSIS — I96 Gangrene, not elsewhere classified: Secondary | ICD-10-CM

## 2019-05-17 DIAGNOSIS — N183 Chronic kidney disease, stage 3 unspecified: Secondary | ICD-10-CM | POA: Diagnosis present

## 2019-05-17 DIAGNOSIS — I10 Essential (primary) hypertension: Secondary | ICD-10-CM | POA: Diagnosis present

## 2019-05-17 LAB — GLUCOSE, CAPILLARY
Glucose-Capillary: 98 mg/dL (ref 70–99)
Glucose-Capillary: 179 mg/dL — ABNORMAL HIGH (ref 70–99)
Glucose-Capillary: 234 mg/dL — ABNORMAL HIGH (ref 70–99)
Glucose-Capillary: 204 mg/dL — ABNORMAL HIGH (ref 70–99)

## 2019-05-17 LAB — BASIC METABOLIC PANEL
Anion gap: 16 — ABNORMAL HIGH (ref 5–15)
BUN: 133 mg/dL — ABNORMAL HIGH (ref 8–23)
CO2: 17 mmol/L — ABNORMAL LOW (ref 22–32)
Calcium: 8.9 mg/dL (ref 8.9–10.3)
Chloride: 106 mmol/L (ref 98–111)
Creatinine, Ser: 4.03 mg/dL — ABNORMAL HIGH (ref 0.61–1.24)
GFR calc Af Amer: 16 mL/min — ABNORMAL LOW (ref >60)
GFR calc non Af Amer: 14 mL/min — ABNORMAL LOW (ref >60)
Glucose, Bld: 95 mg/dL (ref 70–99)
Potassium: 3.7 mmol/L (ref 3.5–5.1)
Sodium: 139 mmol/L (ref 135–145)

## 2019-05-17 LAB — CBC
HCT: 40.1 % (ref 39.0–52.0)
Hemoglobin: 13 g/dL (ref 13.0–17.0)
MCH: 31.2 pg (ref 26.0–34.0)
MCHC: 32.4 g/dL (ref 30.0–36.0)
MCV: 96.2 fL (ref 80.0–100.0)
Platelets: 141 10*3/uL — ABNORMAL LOW (ref 150–400)
RBC: 4.17 MIL/uL — ABNORMAL LOW (ref 4.22–5.81)
RDW: 16.2 % — ABNORMAL HIGH (ref 11.5–15.5)
WBC: 17.9 10*3/uL — ABNORMAL HIGH (ref 4.0–10.5)
nRBC: 0 % (ref 0.0–0.2)

## 2019-05-17 LAB — HEMOGLOBIN A1C
Hgb A1c MFr Bld: 8.2 % — ABNORMAL HIGH (ref 4.8–5.6)
Mean Plasma Glucose: 189 mg/dL

## 2019-05-17 MED ORDER — SENNOSIDES-DOCUSATE SODIUM 8.6-50 MG PO TABS
2.0000 | ORAL_TABLET | Freq: Once | ORAL | Status: AC
  Administered 2019-05-17: 2 via ORAL
  Filled 2019-05-17: qty 2

## 2019-05-17 MED ORDER — METOPROLOL TARTRATE 50 MG PO TABS
50.0000 mg | ORAL_TABLET | Freq: Two times a day (BID) | ORAL | Status: DC
  Administered 2019-05-17: 50 mg via ORAL
  Filled 2019-05-17 (×2): qty 1

## 2019-05-17 MED ORDER — FUROSEMIDE 40 MG PO TABS: 40.0000 mg | ORAL_TABLET | Freq: Every day | ORAL | Status: DC

## 2019-05-17 MED ORDER — DOCUSATE SODIUM 100 MG PO CAPS
100.0000 mg | ORAL_CAPSULE | Freq: Two times a day (BID) | ORAL | Status: DC
  Administered 2019-05-17: 100 mg via ORAL
  Filled 2019-05-17: qty 1

## 2019-05-17 NOTE — Progress Notes (Signed)
Progress Note    Lawrence Tucker  QIW:[id number] DOB: Jun 18, 1950  DOA: 05/13/2019 PCP: Patient, No Pcp Per    Brief Narrative:     Medical records reviewed and are as summarized below:  Lawrence Tucker is an 69 y.o. male with a past medical history that includes hypertension, diabetes, HIV, chronic systolic heart failure, chronic kidney disease stage III, admitted April 22 with worsening lower extremity wound.  MRI reveals abscess and osteomyelitis.  ABI reveals moderate vascular disease. Orthopedic consult requested  Assessment/Plan:   Principal Problem:   Osteomyelitis (HCC) Active Problems:   Diabetic foot infection (HCC)   Diabetes (HCC)   Acute kidney injury superimposed on chronic kidney disease (HCC)   Essential hypertension   Chronic diastolic HF (heart failure) (HCC)   Tachycardia   HIV disease (HCC)   Chronic kidney disease (CKD), stage III (moderate)   Chronic systolic heart failure (HCC)   #1.  Osteomyelitis/abscess.  Related to a nonhealing diabetic wound.  MRI reveals skin ulceration over the lateral malleolus with underlying abscess and osteomyelitis of lateral malleolus, peroneal longus tenosynovitis. ABI with moderate vascular disease.  Work-up also reveals a leukocytosis.  He is afebrile hemodynamically stable and nontoxic-appearing. -Continue cefepime, vancomycin, Flagyl -Monitor -Orthopedic consult  2.  Diabetes type 2.  Hemoglobin A1c 8.2.  Indicating very poor control.  Home medications include glipizide.  Serum glucose 190 on admission.  This morning 95. -Hold home oral agents -Sliding scale insulin for optimal control  #3.  Acute kidney injury superimposed on chronic kidney disease stage III.  Chart review indicates baseline creatinine range 3.5-4.  Creatinine 4.68.  Trending down closer to baseline this morning. -Nephrotoxins as able -Monitor urine output -Recheck in the morning  #4.  Chronic systolic heart failure.  Chart review indicates echo  done this year reveals an EF of 25 to 30%.  Appears compensated.  Home medications include Lasix and metoprolol. -Daily weights -Monitor intake and output -Continue Lasix as able given #3 -Continue metoprolol  #5.  Hypertension..  Home medications include Lasix and metoprolol. -Resume home meds as noted above  #6.  Tachycardia. May be related to his home meds not resumed -ekg -resume metoprolol  #7.  HIV.  Home meds include triumeq -continue home meds -follow labs  Family Communication/Anticipated D/C date and plan/Code Status   DVT prophylaxis: SQ heparin ordered. Code Status: Full Code.  Family Communication: patient at bedside Disposition Plan: Status is: Inpatient  Remains inpatient appropriate because:Inpatient level of care appropriate due to severity of illness   Dispo: The patient is from: Home              Anticipated d/c is to: Home              Anticipated d/c date is: 1 day              Patient currently is not medically stable to d/c.          Medical Consultants:    None.   Anti-Infectives:    None  Subjective:   Awake alert no acute distress. States "I just want the wound cleaned out and I want to go back to [place] for treatment"  Objective:    Vitals:   05/16/19 1444 05/16/19 1954 05/17/19 0423 05/17/19 0749  BP: (!) 115/94 122/75 105/68 124/75  Pulse: 94 (!) 109 60 (!) 102  Resp: 17 18 15 17   Temp: 99 F (37.2 C) 99.8 F (37.7 C) 98.9 F (37.2  C) (!) 97.5 F (36.4 C)  TempSrc: Oral Oral Oral Oral  SpO2:  96% 97% 100%  Weight:      Height:        Intake/Output Summary (Last 24 hours) at 05/17/2019 1136 Last data filed at 05/17/2019 0900 Gross per 24 hour  Intake 778.06 ml  Output 800 ml  Net -21.94 ml   Filed Weights   05/23/2019 1624  Weight: 70.3 kg    Exam: General: Thin frail chronically ill-appearing no acute distress CV: Regular rate and rhythm no murmur gallop or rub lower extremities slightly cool somewhat  mottled left ankle lateral aspect with large eschar tissue foul odor erythema swelling nontender Respiratory: No increased work of breathing breath sounds clear bilaterally hear no rhonchi no wheezes Abdomen: Nondistended soft positive bowel sounds throughout nontender to palpation Musculoskeletal joints without swelling/erythema except for as noted above Neuro: Alert and oriented x3 speech clear facial symmetry  Data Reviewed:   I have personally reviewed following labs and imaging studies:  Labs: Labs show the following:   Basic Metabolic Panel: Recent Labs  Lab 05/05/2019 1645 04/28/2019 1645 05/16/19 0723 05/17/19 0822  NA 138  --  138 139  K 3.7   < > 4.3 3.7  CL 100  --  102 106  CO2 22  --  19* 17*  GLUCOSE 190*  --  118* 95  BUN 142*  --  140* 133*  CREATININE 4.68*  --  4.41* 4.03*  CALCIUM 8.8*  --  8.7* 8.9  MG  --   --  2.4  --    < > = values in this interval not displayed.   GFR Estimated Creatinine Clearance: 17.2 mL/min (A) (by C-G formula based on SCr of 4.03 mg/dL (H)). Liver Function Tests: Recent Labs  Lab 05/08/2019 1645  AST 27  ALT 23  ALKPHOS 61  BILITOT 0.9  PROT 8.1  ALBUMIN 2.7*   No results for input(s): LIPASE, AMYLASE in the last 168 hours. No results for input(s): AMMONIA in the last 168 hours. Coagulation profile Recent Labs  Lab 05/08/2019 1645  INR 1.6*    CBC: Recent Labs  Lab 05/10/2019 1645 05/16/19 0946 05/17/19 0822  WBC 16.1* 16.2* 17.9*  NEUTROABS 14.5* 15.6*  --   HGB 12.4* 11.6* 13.0  HCT 38.3* 36.2* 40.1  MCV 96.7 95.5 96.2  PLT 144* PLATELET CLUMPS NOTED ON SMEAR, COUNT APPEARS DECREASED 141*   Cardiac Enzymes: No results for input(s): CKTOTAL, CKMB, CKMBINDEX, TROPONINI in the last 168 hours. BNP (last 3 results) No results for input(s): PROBNP in the last 8760 hours. CBG: Recent Labs  Lab 05/16/19 0305 05/16/19 0737 05/16/19 1209 05/16/19 1643 05/16/19 2105  GLUCAP 155* 109* 135* 144* 130*    D-Dimer: No results for input(s): DDIMER in the last 72 hours. Hgb A1c: Recent Labs    05/16/19 0946  HGBA1C 8.2*   Lipid Profile: No results for input(s): CHOL, HDL, LDLCALC, TRIG, CHOLHDL, LDLDIRECT in the last 72 hours. Thyroid function studies: No results for input(s): TSH, T4TOTAL, T3FREE, THYROIDAB in the last 72 hours.  Invalid input(s): FREET3 Anemia work up: No results for input(s): VITAMINB12, FOLATE, FERRITIN, TIBC, IRON, RETICCTPCT in the last 72 hours. Sepsis Labs: Recent Labs  Lab 05/11/2019 1645 04/25/2019 2221 05/16/19 0946 05/17/19 0822  WBC 16.1*  --  16.2* 17.9*  LATICACIDVEN 2.2* 1.8  --   --     Microbiology Recent Results (from the past 240 hour(s))  Culture, blood (  Routine x 2)     Status: None (Preliminary result)   Collection Time: 05/11/2019  4:45 PM   Specimen: BLOOD  Result Value Ref Range Status   Specimen Description BLOOD RIGHT ANTECUBITAL  Final   Special Requests   Final    BOTTLES DRAWN AEROBIC AND ANAEROBIC Blood Culture adequate volume   Culture   Final    NO GROWTH < 24 HOURS Performed at [facility], [address] [place], [place] [address]    Report Status PENDING  Incomplete  Culture, blood (Routine x 2)     Status: None (Preliminary result)   Collection Time: 05/13/2019 10:13 PM   Specimen: BLOOD  Result Value Ref Range Status   Specimen Description BLOOD BLOOD RIGHT FOREARM  Final   Special Requests   Final    BOTTLES DRAWN AEROBIC AND ANAEROBIC Blood Culture results may not be optimal due to an inadequate volume of blood received in culture bottles   Culture   Final    NO GROWTH < 12 HOURS Performed at [facility], [address] [place], [place] [address]    Report Status PENDING  Incomplete  SARS CORONAVIRUS 2 (TAT 6-24 HRS) Nasopharyngeal Nasopharyngeal Swab     Status: None   Collection Time: 05/16/19 12:22 AM   Specimen: Nasopharyngeal Swab  Result Value Ref Range Status   SARS Coronavirus 2 NEGATIVE  NEGATIVE Final    Comment: (NOTE) SARS-CoV-2 target nucleic acids are NOT DETECTED. The SARS-CoV-2 RNA is generally detectable in upper and lower respiratory specimens during the acute phase of infection. Negative results do not preclude SARS-CoV-2 infection, do not rule out co-infections with other pathogens, and should not be used as the sole basis for treatment or other patient management decisions. Negative results must be combined with clinical observations, patient history, and epidemiological information. The expected result is Negative. Fact Sheet for Patients: HairSlick.no Fact Sheet for Healthcare Providers: quierodirigir.com This test is not yet approved or cleared by the [place] FDA and  has been authorized for detection and/or diagnosis of SARS-CoV-2 by FDA under an Emergency Use Authorization (EUA). This EUA will remain  in effect (meaning this test can be used) for the duration of the COVID-19 declaration under Section 56 4(b)(1) of the Act, 21 U.S.C. section 360bbb-3(b)(1), unless the authorization is terminated or revoked sooner. Performed at [facility], [address], [place], [place] [address]   Surgical pcr screen     Status: None   Collection Time: 05/16/19  5:16 AM   Specimen: Nasal Mucosa; Nasal Swab  Result Value Ref Range Status   MRSA, PCR NEGATIVE NEGATIVE Final   Staphylococcus aureus NEGATIVE NEGATIVE Final    Comment: (NOTE) The Xpert SA Assay (FDA approved for NASAL specimens in patients 49 years of age and older), is one component of a comprehensive surveillance program. It is not intended to diagnose infection nor to guide or monitor treatment. Performed at [facility], [address], [place], [place] [address]     Procedures and diagnostic studies:  DG Ankle Complete Left  Result Date: 05/09/2019 CLINICAL DATA:  69 year old male with diabetic wound. EXAM: LEFT  ANKLE COMPLETE - 3+ VIEW COMPARISON:  None. FINDINGS: There is no acute fracture or dislocation. No bone erosion or periosteal elevation. The ankle mortise is intact. The bones are mildly osteopenic. There is soft tissue swelling and ulceration with small pockets of soft tissue air over the lateral malleolus in keeping  with diabetic wound. Correlation with clinical exam recommended. IMPRESSION: 1. No acute fracture or dislocation. No radiographic evidence of acute osteomyelitis. MRI or a WBC nuclear scan may provide better evaluation if there is high clinical concern for acute osteomyelitis. 2. Soft tissue swelling and ulceration over the lateral malleolus in keeping with diabetic wound. Electronically Signed   By: Elgie Collard M.D.   On: 05/25/19 17:17   MR FOOT LEFT WO CONTRAST  Result Date: 05/16/2019 CLINICAL DATA:  Left foot infection. EXAM: MRI OF THE LEFT FOOT WITHOUT CONTRAST TECHNIQUE: Multiplanar, multisequence MR imaging of the left ankle was performed. No intravenous contrast was administered. COMPARISON:  Left ankle x-rays from yesterday. FINDINGS: TENDONS Peroneal: Peroneal longus tendon intact with tenosynovitis distal to the peroneal tubercle. Peroneal brevis intact. Posteromedial: Posterior tibial tendon intact. Flexor hallucis longus tendon intact. Flexor digitorum longus tendon intact. Anterior: Tibialis anterior tendon intact. Extensor hallucis longus tendon intact Extensor digitorum longus tendon intact. Achilles:  Intact. Plantar Fascia: Intact. Sequelae of prior plantar fasciitis with thickened proximal central band. LIGAMENTS Lateral: Anterior talofibular ligament intact. Calcaneofibular ligament intact. Posterior talofibular ligament intact. Anterior and posterior tibiofibular ligaments intact. Medial: Deltoid ligament intact. Spring ligament intact. CARTILAGE Ankle Joint: Trace joint effusion. Normal ankle mortise. No chondral defect. Subtalar Joints/Sinus Tarsi: Normal subtalar  joints. Trace posterior subtalar joint effusion. Edema within the sinus tarsi. Bones: Marrow edema with loss of the normal T1 signal involving the lateral malleolus. Remaining marrow signal is preserved. No acute fracture or dislocation. Soft Tissue: Diffuse soft tissue swelling. Skin ulceration over the lateral malleolus with underlying curvilinear 4.2 x 0.6 x 3.9 cm gas and fluid collection. Increased T2 signal within the intrinsic muscles of the forefoot, nonspecific, but likely related to diabetic muscle changes. IMPRESSION: 1. Skin ulceration over the lateral malleolus with underlying curvilinear 4.2 cm abscess and osteomyelitis of the lateral malleolus. 2. Peroneal longus tenosynovitis. Electronically Signed   By: Obie Dredge M.D.   On: 05/16/2019 13:48   VAS [place] ABI WITH/WO TBI  Result Date: 05/17/2019 LOWER EXTREMITY DOPPLER STUDY Indications: Gangrene. High Risk Factors: Hypertension, hyperlipidemia.  Comparison Study: No prior study Performing Technologist: Gertie Fey MHA, RVT, RDCS, RDMS  Examination Guidelines: A complete evaluation includes at minimum, Doppler waveform signals and systolic blood pressure reading at the level of bilateral brachial, anterior tibial, and posterior tibial arteries, when vessel segments are accessible. Bilateral testing is considered an integral part of a complete examination. Photoelectric Plethysmograph (PPG) waveforms and toe systolic pressure readings are included as required and additional duplex testing as needed. Limited examinations for reoccurring indications may be performed as noted.  ABI Findings: +---------+------------------+-----+----------+--------+ Right    Rt Pressure (mmHg)IndexWaveform  Comment  +---------+------------------+-----+----------+--------+ Brachial 117                    triphasic          +---------+------------------+-----+----------+--------+ PTA      76                0.64 monophasic          +---------+------------------+-----+----------+--------+ DP       70                0.59 monophasic         +---------+------------------+-----+----------+--------+ Great Toe36                0.30                    +---------+------------------+-----+----------+--------+ +--------+------------------+-----+----------+-------+  Left    Lt Pressure (mmHg)IndexWaveform  Comment +--------+------------------+-----+----------+-------+ [id number]                    triphasic         +--------+------------------+-----+----------+-------+ PTA                            monophasic        +--------+------------------+-----+----------+-------+ DP      69                0.58 monophasic        +--------+------------------+-----+----------+-------+ +-------+-----------+-----------+------------+------------+ ABI/TBIToday's ABIToday's TBIPrevious ABIPrevious TBI +-------+-----------+-----------+------------+------------+ Right  0.64       0.30                                +-------+-----------+-----------+------------+------------+ Left   0.58       0.27                                +-------+-----------+-----------+------------+------------+  Summary: Right: Resting right ankle-brachial index indicates moderate right lower extremity arterial disease. The right toe-brachial index is abnormal. Left: Resting left ankle-brachial index indicates moderate left lower extremity arterial disease. The left toe-brachial index is abnormal.  *See table(s) above for measurements and observations.    Preliminary     Medications:   . abacavir-dolutegravir-lamiVUDine  1 tablet Oral Daily  . [START ON 04/29/2019] furosemide  40 mg Oral Daily  . heparin  5,000 Units Subcutaneous Q8H  . insulin aspart  0-15 Units Subcutaneous TID AC & HS  . metoprolol tartrate  50 mg Oral BID   Continuous Infusions: . ceFEPime (MAXIPIME) IV 2 g (05/17/19 0829)  . metronidazole 500 mg (05/17/19 0204)    . vancomycin 750 mg (05/17/19 0609)     LOS: 1 day   Radene Gunning NP [facility]   How to contact the [facility] Attending or Consulting provider [place] or covering provider during after hours [place], for this patient?  1. Check the care team in [facility] and look for a) attending/consulting [facility] provider listed and b) the [facility] team listed 2. Log into [email or web address] and use 's universal password to access. If you do not have the password, please contact the hospital operator. 3. Locate the [facility] provider you are looking for under [facility] and page to a number that you can be directly reached. 4. If you still have difficulty reaching the provider, please page the [facility] (Director on Call) for the Hospitalists listed on amion for assistance.  05/17/2019, 11:36 AM

## 2019-05-17 NOTE — Plan of Care (Signed)

## 2019-05-17 NOTE — Progress Notes (Signed)
ABI completed. Refer to "CV Proc" under chart review to view preliminary results.  05/17/2019 10:33 AM Eula Fried., MHA, RVT, RDCS, RDMS

## 2019-05-17 NOTE — Plan of Care (Signed)
  Problem: Elimination: Goal: Will not experience complications related to bowel motility Outcome: Progressing Goal: Will not experience complications related to urinary retention Outcome: Progressing   

## 2019-05-17 NOTE — Progress Notes (Signed)
Pt is on Triumeq for HIV. Ok to add HIV VL and CD4 to monitor per Dr Benjamine Mola.  Ulyses Southward, PharmD, BCIDP, AAHIVP, CPP Infectious Disease Pharmacist 05/17/2019 8:46 AM

## 2019-05-17 NOTE — Progress Notes (Signed)
MEWS score is yellow. Pt is currently off the floor for a procedure.

## 2019-05-17 NOTE — Consult Note (Addendum)
Hospital Consult    Reason for Consult:  LLE wounds, ABI mod stenosis Requesting Physician: Dr. Benjamine Mola MRN #:  [id number]  History of Present Illness: This is a 69 y.o. male with pertinent medical history of HIV, diabetes mellitus, CKD stage III, CHF, hypertension, hyperlipemia who presented to the ER with worsening left foot wound 4/21.He presented to urgent care and was referred to the ER for further treatment. He is traveling with a friend from [place] to attend the upcoming [employer]. He states that he has had a wound on his left ankle for approximately 1 month but gives sort of vague timeline. He states over the past several weeks it started at as a small black dot and has continued to get larger and then over the past couple days it has begun draining. He denies any pain in the left foot or leg presently. He denies any trouble with his legs prior to this starting. He does report that the ulcer on his left medial ankle has been present for many years. He denies any claudication symptoms or rest pain. He denies any numbness or coldness in his feet. He denies any fever or chills, nausea or vomiting.   X ray performed of his left leg reports soft tissue swelling and ulceration over the left lateral malleolus. MRI of left foot showing skin ulceration over the lateral malleolus with underlying curvilinear 4.2cm abscess and osteomyelitis of the lateral malleolus. Peroneal longus tenosynovitis. ABI's show moderate disease with L ABI of .58, R ABI of . 64 with monophasic flow. Vascular surgery has been asked to evaluate him for possible arterial compromise of his left lower extremity   Past Medical History:  Diagnosis Date  . Abscess   . Chronic kidney disease (CKD), stage III (moderate)   . Chronic systolic heart failure (HCC)   . Diabetes mellitus without complication (HCC)   . High cholesterol   . HIV (human immunodeficiency virus infection) (HCC)   . Hypertension   .  Osteomyelitis [facility])     Past Surgical History:  Procedure Laterality Date  . CARDIAC VALVE REPLACEMENT      No Known Allergies  Prior to Admission medications   Medication Sig Start Date End Date Taking? Authorizing Provider  aspirin EC 81 MG tablet Take 81 mg by mouth in the morning.   Yes [provider]  furosemide (LASIX) 40 MG tablet Take 40 mg by mouth in the morning.  03/11/19  Yes [provider]  gabapentin (NEURONTIN) 300 MG capsule Take 300 mg by mouth in the morning and at bedtime.  04/19/19  Yes [provider]  glipiZIDE (GLUCOTROL XL) 5 MG 24 hr tablet Take 5 mg by mouth daily after breakfast.  04/10/19  Yes [provider]  metoprolol tartrate (LOPRESSOR) 100 MG tablet Take 50 mg by mouth 2 (two) times daily.  02/08/18  Yes [provider]  simvastatin (ZOCOR) 40 MG tablet Take 40 mg by mouth at bedtime. 04/25/19  Yes [provider]  TRIUMEQ 600-50-300 MG tablet Take 1 tablet by mouth daily.  05/09/19  Yes [provider]  metolazone (ZAROXOLYN) 2.5 MG tablet Take 2.5 mg by mouth 2 (two) times a week. ON HOLD 04/10/19   [provider]    Social History   Socioeconomic History  . Marital status: Single    Spouse name: Not on file  . Number of children: Not on file  . Years of education: Not on file  . Highest  education level: Not on file  Occupational History  . Not on file  Tobacco Use  . Smoking status: Never Smoker  . Smokeless tobacco: Never Used  Substance and Sexual Activity  . Alcohol use: Yes  . Drug use: Not Currently  . Sexual activity: Not on file  Other Topics Concern  . Not on file  Social History Narrative  . Not on file   Social Determinants of Health   Financial Resource Strain:   . Difficulty of Paying Living Expenses:   Food Insecurity:   . Worried About Programme researcher, broadcasting/film/video in the Last Year:   . Barista in the Last Year:   Transportation Needs:   . Automotive engineer (Medical):   Marland Kitchen Lack of Transportation (Non-Medical):   Physical Activity:   . Days of Exercise per Week:   . Minutes of Exercise per Session:   Stress:   . Feeling of Stress :   Social Connections:   . Frequency of Communication with Friends and Family:   . Frequency of Social Gatherings with Friends and Family:   . Attends Religious Services:   . Active Member of Clubs or Organizations:   . Attends Banker Meetings:   Marland Kitchen Marital Status:   Intimate Partner Violence:   . Fear of Current or Ex-Partner:   . Emotionally Abused:   Marland Kitchen Physically Abused:   . Sexually Abused:      Family History  Problem Relation Age of Onset  . Heart disease Other     ROS: Otherwise negative unless mentioned in HPI  Physical Examination  Vitals:   05/17/19 0749 05/17/19 1300  BP: 124/75   Pulse: (!) 102 75  Resp: 17   Temp: (!) 97.5 F (36.4 C)   SpO2: 100%    Body mass index is 22.89 kg/m.  General:  WDWN in NAD Gait: Not observed HENT: WNL, normocephalic Pulmonary: normal non-labored breathing Cardiac: regular Abdomen: mildly distended but soft, non tender, no masses Skin: without rashes Vascular Exam/Pulses: lateral malleolus with a 4 cm blood filled bullae which on palpation is soft and drains blood and purulent drainage. Non tender. Surrounding ecchymosis of the lateral malleolus. Also has a small blood filled blister on the heel. Medial malleolus with a 1.5 cm dry eschar. Non tender with no erythema       Extremities: 2+ radial pulses bilaterally. 2+ femoral pulses bilaterally, no palpable popliteal pulses, no distal pulses palpable. Dopper DP/PT/Peroneal right foot. Left Doppler PT/Pero. Feet are warm. Motor and sensory intact Musculoskeletal: no muscle wasting or atrophy  Neurologic: A&O X 3;  No focal weakness or paresthesias are detected; speech is fluent/normal Psychiatric:  The pt has Normal affect. Lymph:  Unremarkable  CBC    Component  Value Date/Time   WBC 17.9 (H) 05/17/2019 0822   RBC 4.17 (L) 05/17/2019 0822   HGB 13.0 05/17/2019 0822   HCT 40.1 05/17/2019 0822   PLT 141 (L) 05/17/2019 0822   MCV 96.2 05/17/2019 0822   MCH 31.2 05/17/2019 0822   MCHC 32.4 05/17/2019 0822   RDW 16.2 (H) 05/17/2019 0822   LYMPHSABS 0.2 (L) 05/16/2019 0946   MONOABS 0.5 05/16/2019 0946   EOSABS 0.0 05/16/2019 0946   BASOSABS 0.0 05/16/2019 0946    BMET    Component Value Date/Time   NA 139 05/17/2019 0822   K 3.7 05/17/2019 0822   CL 106 05/17/2019 0822   CO2 17 (L) 05/17/2019 5643  GLUCOSE 95 05/17/2019 0822   BUN 133 (H) 05/17/2019 0822   CREATININE 4.03 (H) 05/17/2019 0822   CALCIUM 8.9 05/17/2019 0822   GFRNONAA 14 (L) 05/17/2019 0822   GFRAA 16 (L) 05/17/2019 0822    COAGS: Lab Results  Component Value Date   INR 1.6 (H) Jun 05, 2019     Non-Invasive Vascular Imaging:   +-------+-----------+-----------+------------+------------+  ABI/TBIToday's ABIToday's TBIPrevious ABIPrevious TBI  +-------+-----------+-----------+------------+------------+  Right 0.64    0.30                  +-------+-----------+-----------+------------+------------+  Left  0.58    0.27                  +-------+-----------+-----------+------------+------------+   Statin:  Yes.   Beta Blocker:  Yes.   Aspirin:  Yes.   ACEI:  No. ARB:  No. CCB use:  No Other antiplatelets/anticoagulants:  No.    ASSESSMENT/PLAN: This is a 69 y.o. male with osteomyelitis and abscess of the lateral malleolus and peroneal longus tenosynovitis. He is currently on Vanc/cefepime/flagyl. 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 He is afebrile but with leukocytosis. I recommend further evaluation with arterial duplex. Ultimately he will need arteriogram and with his CKD he will likely need C02 arteriogram. However he is  very insistent on not wanting any further tests or studies done. Vascular surgeon on call Dr. Trula Slade will see the patient later today to evaluate   Karoline Caldwell PA-C [facility] [phone] 05/17/2019  1:59 PM   Patient expired before I evaluated him  Annamarie Major

## 2019-05-18 ENCOUNTER — Inpatient Hospital Stay (HOSPITAL_COMMUNITY): Payer: Medicare Other

## 2019-05-18 DIAGNOSIS — M86672 Other chronic osteomyelitis, left ankle and foot: Secondary | ICD-10-CM

## 2019-05-18 LAB — GLUCOSE, CAPILLARY: Glucose-Capillary: 127 mg/dL — ABNORMAL HIGH (ref 70–99)

## 2019-05-18 MED FILL — Medication: Qty: 1 | Status: AC

## 2019-05-20 LAB — CULTURE, BLOOD (ROUTINE X 2)
Culture: NO GROWTH
Culture: NO GROWTH
Special Requests: ADEQUATE

## 2019-05-25 NOTE — Progress Notes (Signed)
04/27/2019 @ 1130 Pt's contact person Ron Luciana Axe called, notified of pt's time of death.

## 2019-05-25 NOTE — Code Documentation (Signed)
  Patient Name: Lawrence Tucker   MRN: [id number]   Date of Birth/ Sex: Mar 13, 1950 , male      Admission Date: 05/03/2019  Attending Provider: Joseph Art, DO  Primary Diagnosis: Diabetic ankle ulcer (HCC) [W46.659, L97.309] Diabetic foot infection (HCC) [D35.701, L08.9] Wound of ankle, left, initial encounter [S91.002A]   Indication: Pt was in his usual state of health until this AM, when he was noted to be in ventricular fibrillation/ventricular tachycardia. Pulseless on evaluation. Code blue was subsequently called. At the time of arrival on scene, ACLS protocol was underway.   Technical Description:  - CPR performance duration:  30 minutes  - Was defibrillation or cardioversion used? Yes   - Was external pacer placed? Yes  - Was patient intubated pre/post CPR? Yes   Medications Administered: Y = Yes; Blank = No Amiodarone  x  Atropine    Calcium  x  Epinephrine  x  Lidocaine    Magnesium  x  Norepinephrine    Phenylephrine    Sodium bicarbonate  x  Vasopressin    Other    Post CPR evaluation:  - Final Status - Was patient successfully resuscitated ? No   Miscellaneous Information:  - Time of death:  1:14 AM  - Primary team notified?  Yes, at bedside during code  - Family Notified? No, unable to reach friend who was listed as contact person after multiple attempts, primary care aware     Claudean Severance, MD   Jun 06, 2019, 1:22 AM

## 2019-05-25 NOTE — Progress Notes (Signed)
At 0039 CCMD called stating that the pt is having ventricular fibrillation  and ventricular tachycardia. RN rushed to the room and found pt unresponsive; No palpable pulse noted. Code blue was initiated; Rapid response team was notified. On-call provider Linton Flemings was notified.

## 2019-05-25 NOTE — Progress Notes (Addendum)
Called to pt rm regarding code.  I made nine attempts to contact his friend who is also his emergency on his chart. There was no answer on either attempt.  At this point, next of kin nor his contact person has been notified that pt has died.  Please page if additional sister is needed. Elmarie Shiley, MDiv   05/22/2019 0100  Clinical Encounter Type  Visited With Health care provider

## 2019-05-25 NOTE — Progress Notes (Signed)
RN tried to reach pt's friend Ron; no answer.

## 2019-05-25 DEATH — deceased

## 2019-06-25 NOTE — Death Summary Note (Signed)
DEATH SUMMARY   Patient Details  Name: Lawrence Tucker MRN: [id number] DOB: October 30, 1950  Admission/Discharge Information   Admit Date:  2019/05/23  Date of Death: Date of Death: May 26, 2019  Time of Death: Time of Death: 0114  Length of Stay: 2  Referring Physician: Patient, No Pcp Per   Reason(s) for Hospitalization  Worsening foot wound  Diagnoses  Preliminary cause of death:  Secondary Diagnoses (including complications and co-morbidities):  Principal Problem:   Osteomyelitis (HCC) Active Problems:   Diabetic foot infection (HCC)   Diabetes (HCC)   Acute kidney injury superimposed on chronic kidney disease (HCC)   HIV disease (HCC)   Essential hypertension   Chronic kidney disease (CKD), stage III (moderate)   Chronic systolic heart failure (HCC)   Tachycardia   Brief Hospital Course (including significant findings, care, treatment, and services provided and events leading to death)  Lawrence Tucker is a 69 y.o. 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  He has multiple medical issues including HIV, diabetes, an ejection fracture of 25 to 35%, hypertension, hyperlipidemia.  Patient was recently hospitalized in [place] for acute on chronic renal failure and was seen by multiple specialties including cardiology as well as nephrology.  He presented to the hospital in regards to a worsening left foot ulcer.  MRI was done that revealed a underlying abscess and osteomyelitis over the lateral malleolus where the skin ulceration was.  ABIs showed moderate vascular disease.  Vascular surgery was consulted for management of the wound as well as possible revascularization efforts. Patient was resistant to have any intervention done here. Patient appeared well and was not septic .    On the night of 05/26/2022 patient developed ventricular fibrillation and tachycardia on telemetry.  RN found patient unresponsive and pulseless: CODE BLUE was initiated.  ACLS  protocol was initiated but resuscitation efforts failed.   Pertinent Labs and Studies  Significant Diagnostic Studies DG Ankle Complete Left  Result Date: 05-23-19 CLINICAL DATA:  69 year old male with diabetic wound. EXAM: LEFT ANKLE COMPLETE - 3+ VIEW COMPARISON:  None. FINDINGS: There is no acute fracture or dislocation. No bone erosion or periosteal elevation. The ankle mortise is intact. The bones are mildly osteopenic. There is soft tissue swelling and ulceration with small pockets of soft tissue air over the lateral malleolus in keeping with diabetic wound. Correlation with clinical exam recommended. IMPRESSION: 1. No acute fracture or dislocation. No radiographic evidence of acute osteomyelitis. MRI or a WBC nuclear scan may provide better evaluation if there is high clinical concern for acute osteomyelitis. 2. Soft tissue swelling and ulceration over the lateral malleolus in keeping with diabetic wound. Electronically Signed   By: Elgie Collard M.D.   On: 05/23/19 17:17   MR FOOT LEFT WO CONTRAST  Result Date: 05/16/2019 CLINICAL DATA:  Left foot infection. EXAM: MRI OF THE LEFT FOOT WITHOUT CONTRAST TECHNIQUE: Multiplanar, multisequence MR imaging of the left ankle was performed. No intravenous contrast was administered. COMPARISON:  Left ankle x-rays from yesterday. FINDINGS: TENDONS Peroneal: Peroneal longus tendon intact with tenosynovitis distal to the peroneal tubercle. Peroneal brevis intact. Posteromedial: Posterior tibial tendon intact. Flexor hallucis longus tendon intact. Flexor digitorum longus tendon intact. Anterior: Tibialis anterior tendon intact. Extensor hallucis longus tendon intact Extensor digitorum longus tendon intact. Achilles:  Intact. Plantar Fascia: Intact. Sequelae of prior plantar fasciitis with thickened proximal central band. LIGAMENTS Lateral: Anterior talofibular ligament intact. Calcaneofibular ligament intact. Posterior talofibular ligament intact.  Anterior and posterior tibiofibular ligaments intact. Medial: Deltoid ligament intact. Spring ligament intact. CARTILAGE Ankle Joint: Trace joint effusion. Normal ankle mortise. No chondral defect. Subtalar Joints/Sinus Tarsi: Normal subtalar joints. Trace posterior subtalar joint effusion. Edema within the sinus tarsi. Bones: Marrow edema with loss of the normal T1 signal involving the lateral malleolus. Remaining marrow signal is preserved. No acute fracture or dislocation. Soft Tissue: Diffuse soft tissue swelling. Skin ulceration over the lateral malleolus with underlying curvilinear 4.2 x 0.6 x 3.9 cm gas and fluid collection. Increased T2 signal within the intrinsic muscles of the forefoot, nonspecific, but likely related to diabetic muscle changes. IMPRESSION: 1. Skin ulceration over the lateral malleolus with underlying curvilinear 4.2 cm abscess and osteomyelitis of the lateral malleolus. 2. Peroneal longus tenosynovitis. Electronically Signed   By: Titus Dubin M.D.   On: 05/16/2019 13:48   DG CHEST PORT 1 VIEW  Result Date: 05-31-19 CLINICAL DATA:  Active CPR. EXAM: PORTABLE CHEST 1 VIEW COMPARISON:  None. FINDINGS: An endotracheal tube is seen with its distal tip approximately 4.3 cm from the carina. Mild, diffuse, chronic appearing increased lung markings are seen without evidence of focal consolidation, pleural effusion or pneumothorax. The cardiac silhouette is mildly enlarged. The visualized skeletal structures are unremarkable. Radiopaque surgical clips are seen overlying the right upper quadrant. IMPRESSION: Endotracheal tube positioning as described above with no evidence of acute or active cardiopulmonary disease. Electronically Signed   By: Virgina Norfolk M.D.   On: 05-31-19 01:25   VAS [place] ABI WITH/WO TBI  Result Date: 05/17/2019 LOWER EXTREMITY DOPPLER STUDY Indications: Gangrene. High Risk Factors: Hypertension, hyperlipidemia.  Comparison Study: No prior study Performing  Technologist: Maudry Mayhew MHA, RVT, RDCS, RDMS  Examination Guidelines: A complete evaluation includes at minimum, Doppler waveform signals and systolic blood pressure reading at the level of bilateral brachial, anterior tibial, and posterior tibial arteries, when vessel segments are accessible. Bilateral testing is considered an integral part of a complete examination. Photoelectric Plethysmograph (PPG) waveforms and toe systolic pressure readings are included as required and additional duplex testing as needed. Limited examinations for reoccurring indications may be performed as noted.  ABI Findings: +---------+------------------+-----+----------+--------+ Right    Rt Pressure (mmHg)IndexWaveform  Comment  +---------+------------------+-----+----------+--------+ Brachial 117                    triphasic          +---------+------------------+-----+----------+--------+ PTA      76                0.64 monophasic         +---------+------------------+-----+----------+--------+ DP       70                0.59 monophasic         +---------+------------------+-----+----------+--------+ Great Toe36                0.30                    +---------+------------------+-----+----------+--------+ +--------+------------------+-----+----------+-------+ Left    Lt Pressure (mmHg)IndexWaveform  Comment +--------+------------------+-----+----------+-------+ [id number]                    triphasic         +--------+------------------+-----+----------+-------+ PTA     Noncompressible        monophasic        +--------+------------------+-----+----------+-------+ DP      69  0.58 monophasic        +--------+------------------+-----+----------+-------+ +-------+-----------+-----------+------------+------------+ ABI/TBIToday's ABIToday's TBIPrevious ABIPrevious TBI +-------+-----------+-----------+------------+------------+ Right  0.64       0.30                                 +-------+-----------+-----------+------------+------------+ Left   0.58       0.27                                +-------+-----------+-----------+------------+------------+  Summary: Right: Resting right ankle-brachial index indicates moderate right lower extremity arterial disease. The right toe-brachial index is abnormal. Left: Resting left ankle-brachial index indicates moderate left lower extremity arterial disease. The left toe-brachial index is abnormal.  *See table(s) above for measurements and observations.  Electronically signed by Coral Else MD on 05/17/2019 at 5:07:45 PM.   Final     Microbiology No results found for this or any previous visit (from the past 240 hour(s)).  Lab Basic Metabolic Panel: No results for input(s): NA, K, CL, CO2, GLUCOSE, BUN, CREATININE, CALCIUM, MG, PHOS in the last 168 hours. Liver Function Tests: No results for input(s): AST, ALT, ALKPHOS, BILITOT, PROT, ALBUMIN in the last 168 hours. No results for input(s): LIPASE, AMYLASE in the last 168 hours. No results for input(s): AMMONIA in the last 168 hours. CBC: No results for input(s): WBC, NEUTROABS, HGB, HCT, MCV, PLT in the last 168 hours. Cardiac Enzymes: No results for input(s): CKTOTAL, CKMB, CKMBINDEX, TROPONINI in the last 168 hours. Sepsis Labs: No results for input(s): PROCALCITON, WBC, LATICACIDVEN in the last 168 hours.     Joseph Art 05/28/2019, 3:28 PM

## 2021-08-18 IMAGING — DX DG CHEST 1V PORT
1 series · 1 of 1 positions shown · non-contrast
Comparison: None.

CLINICAL DATA: Active CPR.

EXAM:
PORTABLE CHEST 1 VIEW

[chest]
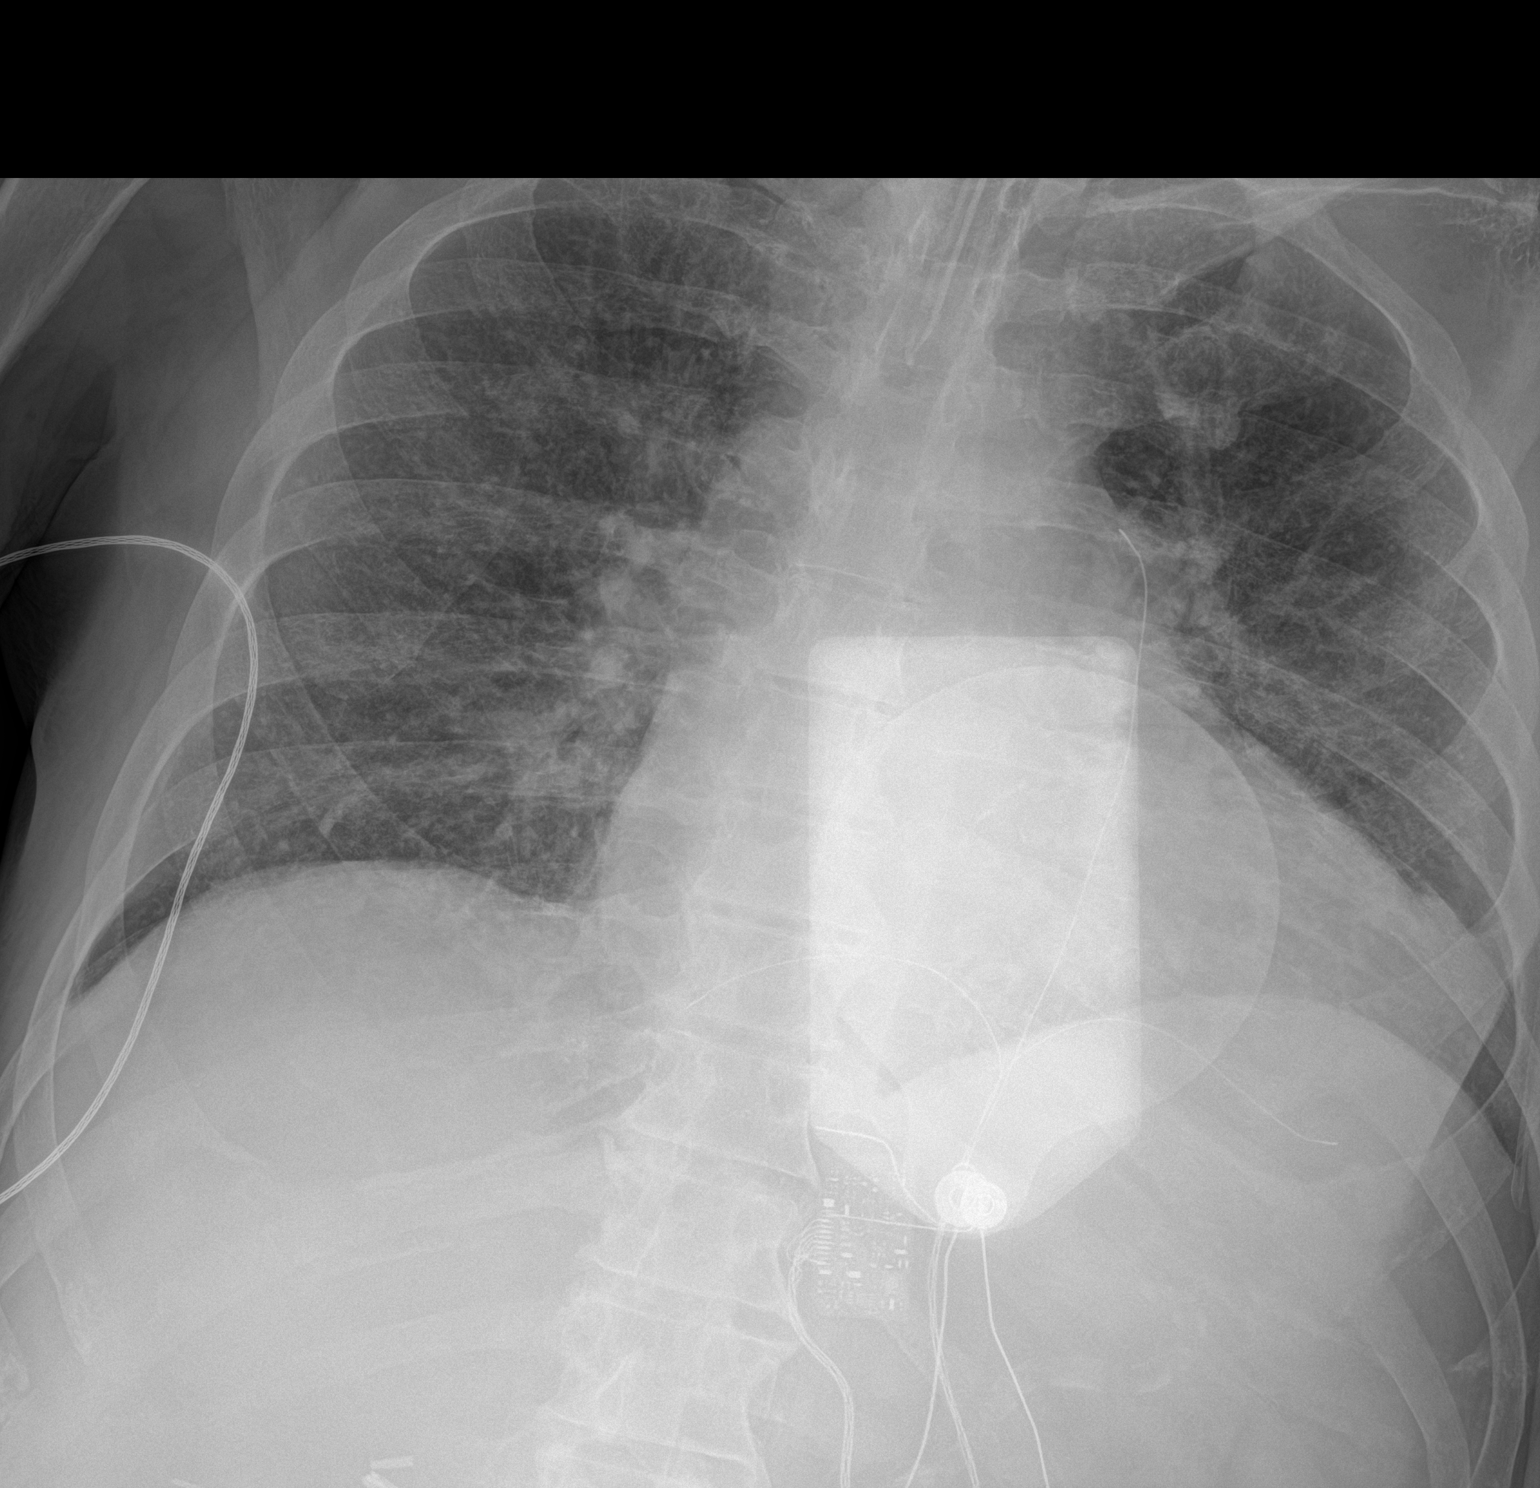

[1 of 1 positions shown; findings below may reference images not displayed]

FINDINGS: An endotracheal tube is seen with its distal tip approximately
cm from the carina.

Mild, diffuse, chronic appearing increased lung markings are seen
without evidence of focal consolidation, pleural effusion or
pneumothorax.

The cardiac silhouette is mildly enlarged.

The visualized skeletal structures are unremarkable.

Radiopaque surgical clips are seen overlying the right upper
quadrant.
IMPRESSION: Endotracheal tube positioning as described above with no evidence of
acute or active cardiopulmonary disease.
